# Patient Record
Sex: Male | Born: 2000 | Race: White | Hispanic: No | Marital: Single | Smoking: Current every day smoker
Health system: Southern US, Community
[De-identification: ages and names within clinical notes are randomized; demographics above are authoritative.]

## PROBLEM LIST (undated history)

## (undated) DIAGNOSIS — F419 Anxiety disorder, unspecified: Secondary | ICD-10-CM

## (undated) DIAGNOSIS — H9190 Unspecified hearing loss, unspecified ear: Secondary | ICD-10-CM

## (undated) DIAGNOSIS — R278 Other lack of coordination: Secondary | ICD-10-CM

## (undated) DIAGNOSIS — F909 Attention-deficit hyperactivity disorder, unspecified type: Secondary | ICD-10-CM

## (undated) DIAGNOSIS — F902 Attention-deficit hyperactivity disorder, combined type: Secondary | ICD-10-CM

## (undated) HISTORY — DX: Other lack of coordination: R27.8

## (undated) HISTORY — DX: Anxiety disorder, unspecified: F41.9

## (undated) HISTORY — DX: Attention-deficit hyperactivity disorder, unspecified type: F90.9

## (undated) HISTORY — DX: Unspecified hearing loss, unspecified ear: H91.90

## (undated) HISTORY — DX: Attention-deficit hyperactivity disorder, combined type: F90.2

## (undated) HISTORY — PX: TONSILLECTOMY: SUR1361

---
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
## 2019-01-17 ENCOUNTER — Other Ambulatory Visit: Payer: Self-pay

## 2019-01-17 ENCOUNTER — Encounter: Payer: Self-pay | Admitting: Pediatrics

## 2019-01-17 ENCOUNTER — Ambulatory Visit (INDEPENDENT_AMBULATORY_CARE_PROVIDER_SITE_OTHER): Payer: No Typology Code available for payment source | Admitting: Pediatrics

## 2019-01-17 DIAGNOSIS — Z79899 Other long term (current) drug therapy: Secondary | ICD-10-CM | POA: Diagnosis not present

## 2019-01-17 DIAGNOSIS — F902 Attention-deficit hyperactivity disorder, combined type: Secondary | ICD-10-CM

## 2019-01-17 DIAGNOSIS — H903 Sensorineural hearing loss, bilateral: Secondary | ICD-10-CM | POA: Diagnosis not present

## 2019-01-17 DIAGNOSIS — R278 Other lack of coordination: Secondary | ICD-10-CM | POA: Diagnosis not present

## 2019-01-17 DIAGNOSIS — Z719 Counseling, unspecified: Secondary | ICD-10-CM

## 2019-01-17 DIAGNOSIS — Z7189 Other specified counseling: Secondary | ICD-10-CM

## 2019-01-17 MED ORDER — METHYLPHENIDATE HCL ER (OSM) 54 MG PO TBCR: 54.0000 mg | EXTENDED_RELEASE_TABLET | ORAL | 0 refills | Status: DC

## 2019-01-17 MED ORDER — ATOMOXETINE HCL 100 MG PO CAPS: 100.0000 mg | ORAL_CAPSULE | Freq: Every day | ORAL | 2 refills | Status: DC

## 2019-01-17 MED ORDER — GUANFACINE HCL ER 2 MG PO TB24: 2.0000 mg | ORAL_TABLET | Freq: Every morning | ORAL | 2 refills | Status: DC

## 2019-01-17 NOTE — Progress Notes (Signed)
[facility] [address] [place] [address] Dept: [phone] Dept Fax: [phone]  Medication Check by Zoom due to COVID-19  Patient ID:  Garrett CoronaMitchell Estrada  male DOB: 02/16/2001   17 y.o.   MRN: [id number]   DATE:01/17/19  PCP: Michiel Sitesummings, Mark, MD  Interviewed: Garrett NajjarMitchell D Estrada and Mother  Name: Garrett DoffingMelody Estrada Location: Their home [facility]  Virtual Visit via Video Note Connected with Garrett NajjarMitchell D Estrada on 01/17/19 at  2:00 PM EDT by video enabled telemedicine application and verified that I am speaking with the correct person using two identifiers.    I discussed the limitations, risks, security and privacy concerns of performing an evaluation and management service by telephone and the availability of in person appointments. I also discussed with the parents that there may be a patient responsible charge related to this service. The parents expressed understanding and agreed to proceed.  HISTORY OF PRESENT ILLNESS/CURRENT STATUS: Garrett NajjarMitchell D Estrada is being followed for medication management for ADHD, dysgraphia and learning differences.   Last visit on 10/18/2018  Garrett RileyMitchell currently prescribed Strattera 100 mg, Concerta 54 mg and Intuniv 2 mg   By 0800 Eating well (eating breakfast, lunch and dinner).   Sleeping: bedtime 2300 pm and wakes at 0700  sleeping through the night.   EDUCATION: School: [employer] Year/Grade: 11th grade  Not as much work as usual school Garrett RileyMitchell is currently out of school for social distancing due to COVID-19.  Using zoom, [employer] classroom and email.  Has paper work as well. Check email at 0800 and has math zoom at 0900  Likes it better. Not as distracted, when not with whole class. Just finished spring break.just finished 3rd week of on line instructions. Not a set schedule for other classes. Doing all classes.  Activities/ Exercise:  daily Got a new bike  Screen time: (phone, tablet, TV, computer): social and practicing rubiks cubes. Texts friends   MEDICAL HISTORY: Individual Medical History/ Review of Systems: Changes? :Yes had MRI 11/28/2018 WNL. Has continued with paresthesia starts in hands and can go up arms to torso, happens up to 3 times per week.  Family Medical/ Social History: Changes? No   Patient Lives with: mother and father Sister 13 years  Current Medications:  Strattera 100 mg Concerta 54 mg Intuniv 2 mg  Medication Side Effects: None  MENTAL HEALTH: Mental Health Issues:    Denies sadness, loneliness or depression. No self harm or thoughts of self harm or injury. Denies fears, worries and anxieties. Has good peer relations and is not a bully nor is victimized.  DIAGNOSES:    ICD-10-CM   1. ADHD (attention deficit hyperactivity disorder), combined type F90.2   2. Dysgraphia R27.8   3. Bilateral sensorineural hearing loss H90.3   4. Medication management Z79.899   5. Patient counseled Z71.9   6. Parenting dynamics counseling Z71.89   7. Counseling and coordination of care Z71.89     RECOMMENDATIONS:  Patient Instructions  DISCUSSION: Counseled regarding the following coordination of care items:  Continue medication as directed Strattera 100 mg every morning Concerta 54 mg eery morning Intuniv 2 mg every morning RX for above e-scribed and sent to pharmacy on record  [facility] - [place], [place] - [address] [place] [address] Phone: [phone] Fax: [phone]  Counseled medication administration, effects, and possible side effects.  ADHD medications discussed to include different medications  and pharmacologic properties of each. Recommendation for specific medication to include dose, administration, expected effects, possible side effects and the risk to benefit ratio of medication management.  Advised importance of:  Good sleep hygiene (8- 10  hours per night) Limited screen time (none on school nights, no more than 2 hours on weekends) Regular exercise(outside and active play) Healthy eating (drink water, no sodas/sweet tea)  The unknowns surrounding coronavirus (also known as COVID-19) can be anxiety-producing in both adults and children alike. During these times of uncertainty, you play an important role as a parent, caregiver and support system for your kids. Here are 3 ways you can help your kids cope with their worries.  1. Be intentional in setting aside time to listen to your children's thoughts and concerns. Ask your kids how they're feeling, and really listen when they speak. As parents, it's hard to see our kids struggling, and we get the urge to make them feel better right away - but just listen first. Then, provide validating statements that show your kids that how they're feeling makes sense and that other people are feeling this way, too.  2. Be mindful of your children's news and social media intake. If your family typically lets the news run in the background as you go about your day, take this time to set limits and choose specific times to watch the news. Be mindful of what exactly your children watch.  Additionally, be mindful of how you talk about the news with your children. It's not just what we say that matters, but how we say it. If you're carrying a lot of anxiety, be careful of how it comes through as you speak and identify ways to manage that.  3. Empower your kids to help others by teaching them about social distancing and healthy habits. Framing social distancing as something your kids can do to help others empowers them to feel more in control of the situation. In terms of healthy habit behaviors like coughing in your elbow and handwashing, model them for your kids. Provide attention and praise when they practice those behaviors. For some of the more difficult habits - like avoiding touching your face - try a  fun reinforcement system. Setting a timer for a very short time and seeing how long kids can go without touching their face is a way to make practicing healthy habits fun.  About the Author Charlyne Mom, PhD    Discussed continued need for routine, structure, motivation, reward and positive reinforcement  Encouraged recommended limitations on TV, tablets, phones, video games and computers for non-educational activities.  Encouraged physical activity and outdoor play, maintaining social distancing.  Discussed how to talk to anxious children about coronavirus.   Referred to ADDitudemag.com for resources about engaging children who are at home in home and online study.    NEXT APPOINTMENT:  Return in about 3 months (around 04/18/2019) for Medication Check. Please call the office for a sooner appointment if problems arise.  Medical Decision-making: More than 50% of the appointment was spent counseling and discussing diagnosis and management of symptoms with the patient and family.  I discussed the assessment and treatment plan with the parent. The parent was provided an opportunity to ask questions and all were answered. The parent agreed with the plan and demonstrated an understanding of the instructions.   The parent was advised to call back or seek an in-person evaluation if the symptoms worsen or if the condition fails to improve as anticipated.  I provided 25 minutes of non-face-to-face time during this encounter.   Completed record review for 10 minutes prior to the virtual video visit.   Leticia Penna, NP  Counseling Time: 25 minutes   Total Contact Time: 25 minutes

## 2019-01-17 NOTE — Patient Instructions (Signed)
DISCUSSION: Counseled regarding the following coordination of care items:  Continue medication as directed Strattera 100 mg every morning Concerta 54 mg eery morning Intuniv 2 mg every morning RX for above e-scribed and sent to pharmacy on record  [facility] - [place],  - [address] [place] [address] Phone: [phone] Fax: [phone]  Counseled medication administration, effects, and possible side effects.  ADHD medications discussed to include different medications and pharmacologic properties of each. Recommendation for specific medication to include dose, administration, expected effects, possible side effects and the risk to benefit ratio of medication management.  Advised importance of:  Good sleep hygiene (8- 10 hours per night) Limited screen time (none on school nights, no more than 2 hours on weekends) Regular exercise(outside and active play) Healthy eating (drink water, no sodas/sweet tea)  The unknowns surrounding coronavirus (also known as COVID-19) can be anxiety-producing in both adults and children alike. During these times of uncertainty, you play an important role as a parent, caregiver and support system for your kids. Here are 3 ways you can help your kids cope with their worries.  1. Be intentional in setting aside time to listen to your children's thoughts and concerns. Ask your kids how they're feeling, and really listen when they speak. As parents, it's hard to see our kids struggling, and we get the urge to make them feel better right away - but just listen first. Then, provide validating statements that show your kids that how they're feeling makes sense and that other people are feeling this way, too.  2. Be mindful of your children's news and social media intake. If your family typically lets the news run in the background as you go about your day, take this time to set limits and choose specific times to watch the news.  Be mindful of what exactly your children watch.  Additionally, be mindful of how you talk about the news with your children. It's not just what we say that matters, but how we say it. If you're carrying a lot of anxiety, be careful of how it comes through as you speak and identify ways to manage that.  3. Empower your kids to help others by teaching them about social distancing and healthy habits. Framing social distancing as something your kids can do to help others empowers them to feel more in control of the situation. In terms of healthy habit behaviors like coughing in your elbow and handwashing, model them for your kids. Provide attention and praise when they practice those behaviors. For some of the more difficult habits - like avoiding touching your face - try a fun reinforcement system. Setting a timer for a very short time and seeing how long kids can go without touching their face is a way to make practicing healthy habits fun.  About the Author Charlyne Mom, PhD

## 2019-02-11 IMAGING — MR MR LUMBAR SPINE W/O CM
4 of 5 series · 15 of 48 positions shown · non-contrast
Comparison: None.

CLINICAL DATA: Low back pain over the last 8 months subsequent to a
fall from a tree.

EXAM:
MRI LUMBAR SPINE WITHOUT CONTRAST
TECHNIQUE: Multiplanar, multisequence MR imaging of the lumbar spine was
performed. No intravenous contrast was administered.

[Series 3: T2 · sagittal · 4.0mm · 0.68mm/px · 6 of 13 slices shown (1 of 2)]
[im 1/13]
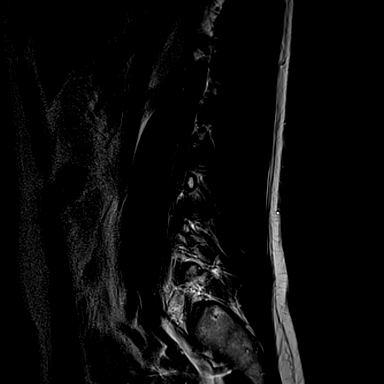
[im 3/13]
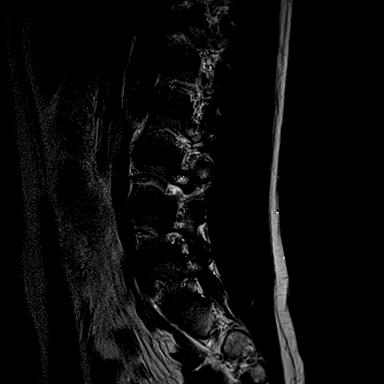
[im 5/13]
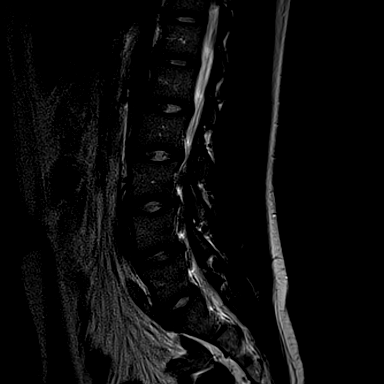
[im 8/13]
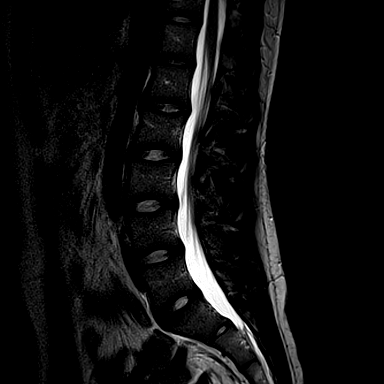
[im 10/13]
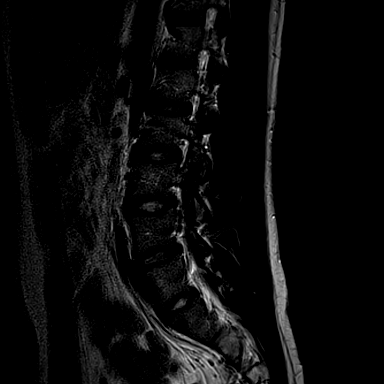
[im 13/13]
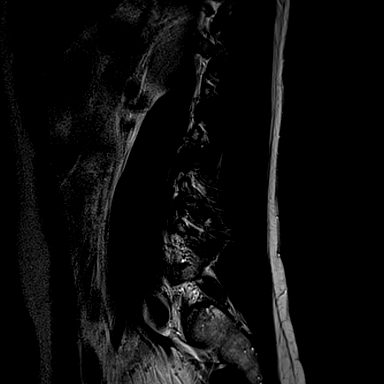

[Series 4: T1 · sagittal · 4.0mm · 0.34mm/px · 3 of 13 slices shown (1 of 2)]
[im 1/13]
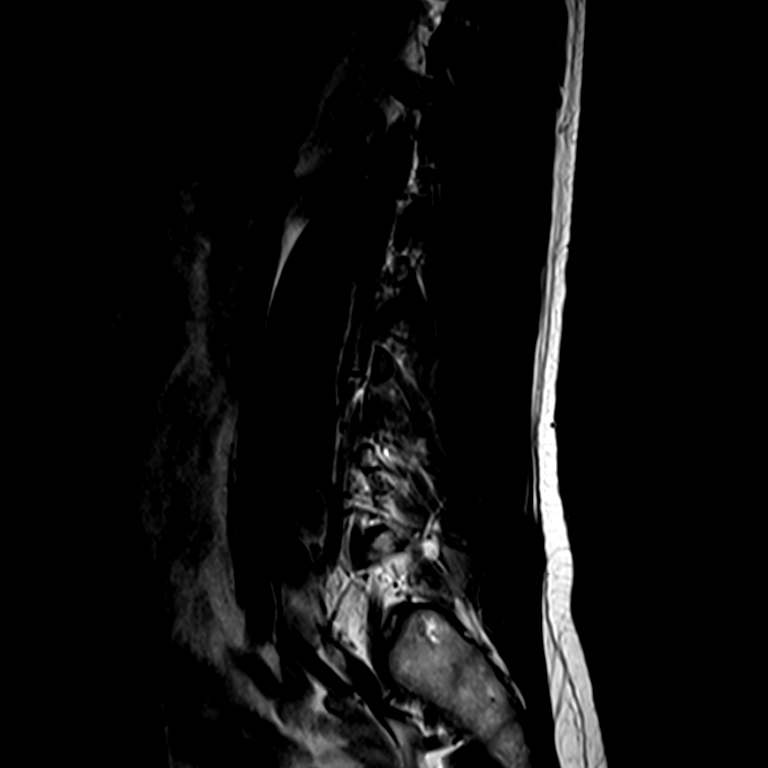
[im 7/13]
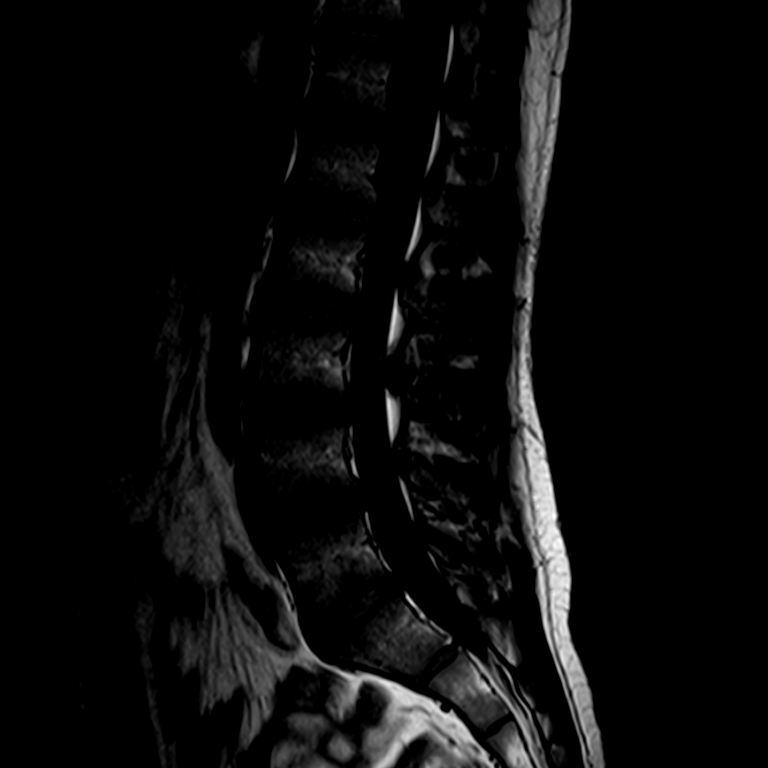
[im 13/13]
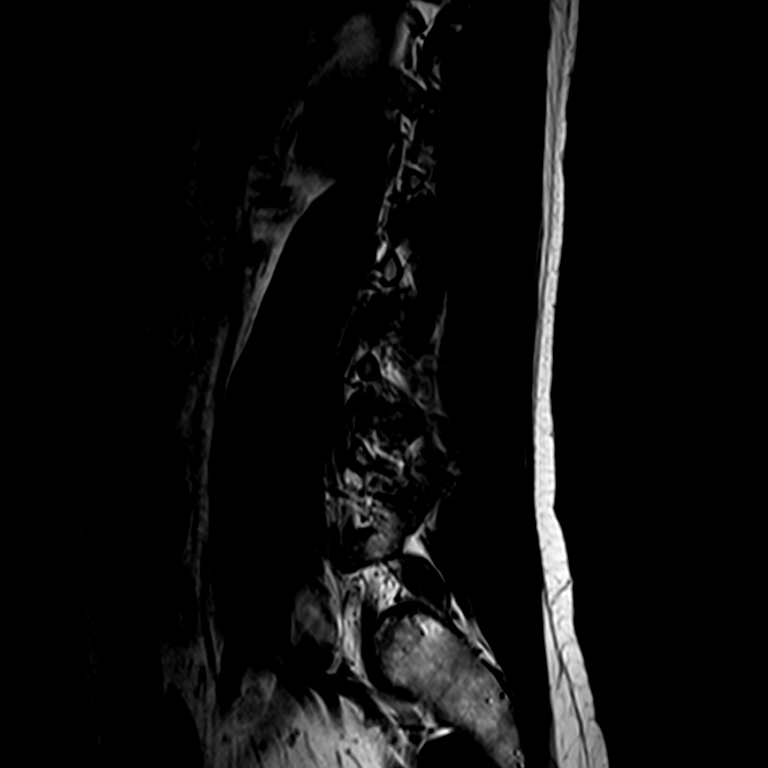

[Series 6: T2 · axial · 4.0mm · 0.22mm/px · z∈[-64,+75]mm · 3 of 38 slices shown (2 of 2)]
[im 5/38]
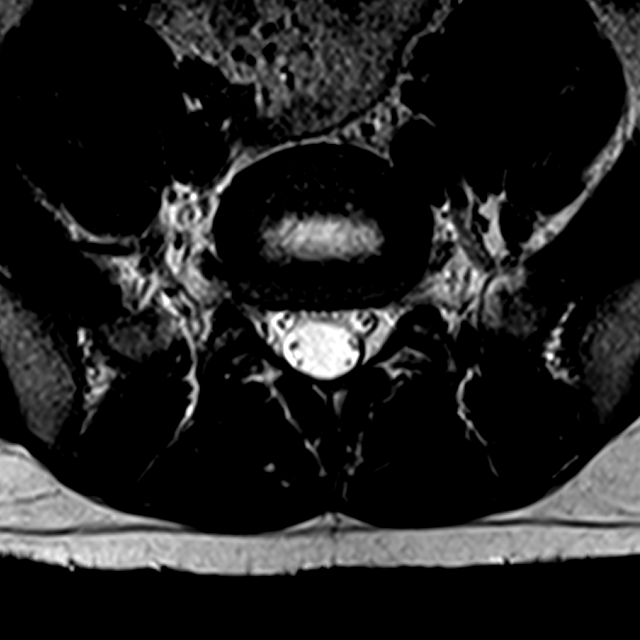
[im 20/38]
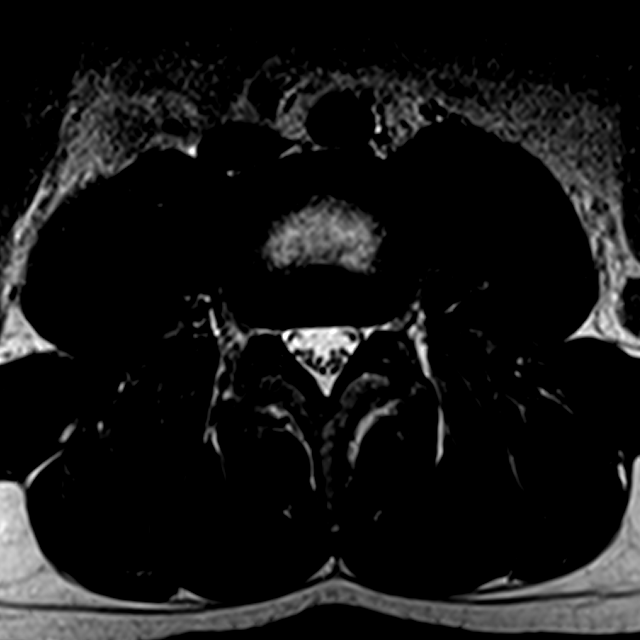
[im 33/38]
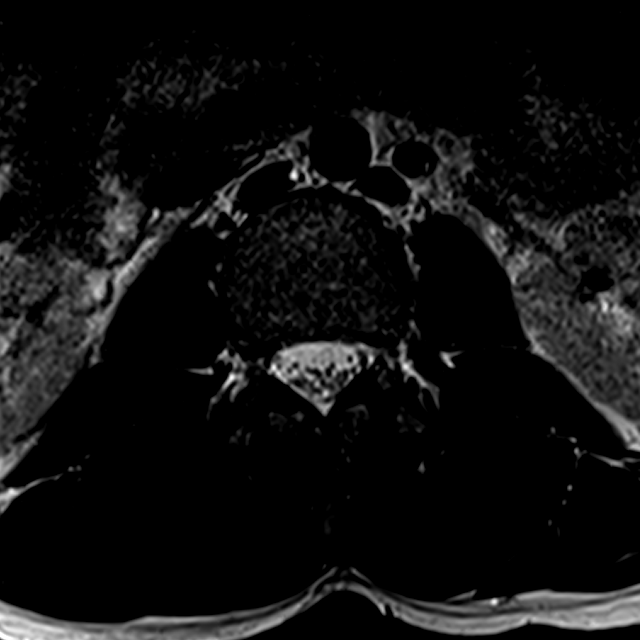

[Series 7: T1 · axial · 4.0mm · 0.22mm/px · z∈[-64,+75]mm · 3 of 38 slices shown (2 of 2)]
[im 5/38]
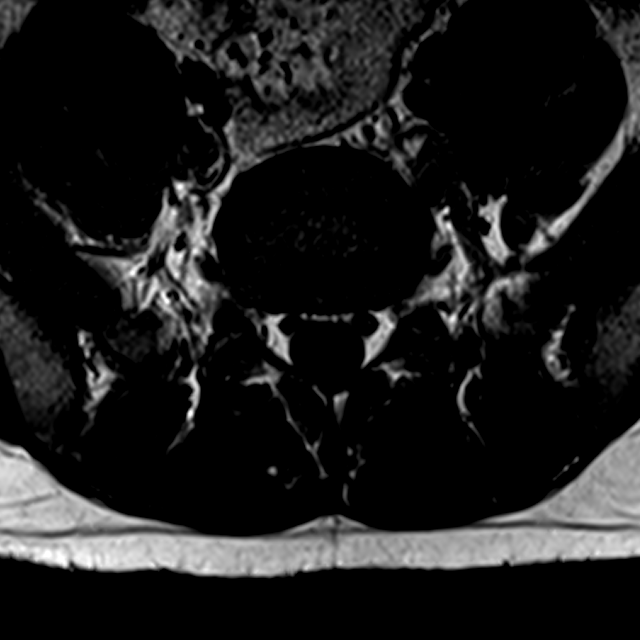
[im 20/38]
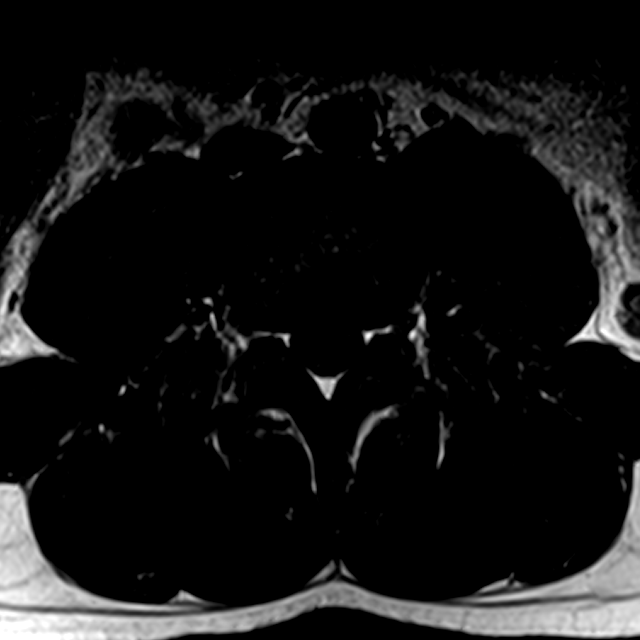
[im 33/38]
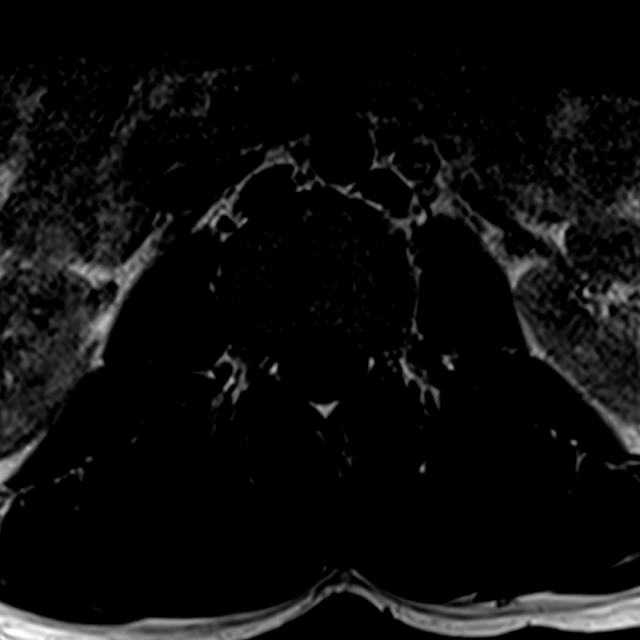

[15 of 48 positions shown; findings below may reference images not displayed]

FINDINGS: Segmentation:  5 lumbar type vertebral bodies.

Alignment:  Normal

Vertebrae: Abnormal edema of the posterior elements of L3. Suspicion
of pars defects or pars fractures at this level. CT scan would be
suggested for more accurate evaluation.

Conus medullaris and cauda equina: Conus extends to the L1 level.
Conus and cauda equina appear normal.

Paraspinal and other soft tissues: Negative

Disc levels:

No abnormality at L2-3 or above.

L3-4: Mild desiccation and bulging of the disc. No herniation or
stenosis. As noted above, there is abnormal edema in the posterior
elements, probably secondary to pars fractures or pars defects. CT
scan recommended for more accurate evaluation.

L4-5: Mild disc bulge.  No stenosis or neural compression.

L5-S1: Normal interspace.
IMPRESSION: At L3-4, there is abnormal edema in the posterior elements. I
suspect that there are bilateral pars fractures or pars defects at
this level. Mild bulging of the L3-4 disc without compressive
stenosis. CT scan would be suggested to more accurately characterize
the bony pathology.

## 2019-02-22 ENCOUNTER — Other Ambulatory Visit: Payer: Self-pay | Admitting: Pediatrics

## 2019-02-22 NOTE — Telephone Encounter (Signed)
Last visit 01/17/2019

## 2019-02-22 NOTE — Telephone Encounter (Signed)
E-Prescribed Concerta 54 directly to  [facility] - [place], [place] - [address] [place] [address] Phone: [phone] Fax: [phone]  

## 2019-03-22 ENCOUNTER — Telehealth: Payer: Self-pay | Admitting: Pediatrics

## 2019-03-22 NOTE — Telephone Encounter (Signed)
Last visit 01/17/2019

## 2019-03-22 NOTE — Telephone Encounter (Signed)
RX for above e-scribed and sent to pharmacy on record  [facility] - [place], [place] - [address]  [place] [address] Phone: [phone] Fax: [phone] 

## 2019-03-22 NOTE — Telephone Encounter (Signed)
[facility] scheduled return appointment

## 2019-04-05 ENCOUNTER — Other Ambulatory Visit: Payer: Self-pay

## 2019-04-05 ENCOUNTER — Encounter (HOSPITAL_COMMUNITY): Payer: Self-pay | Admitting: Psychiatry

## 2019-04-05 ENCOUNTER — Ambulatory Visit (INDEPENDENT_AMBULATORY_CARE_PROVIDER_SITE_OTHER): Payer: Medicaid Other | Admitting: Psychiatry

## 2019-04-05 DIAGNOSIS — F411 Generalized anxiety disorder: Secondary | ICD-10-CM | POA: Diagnosis not present

## 2019-04-05 DIAGNOSIS — F988 Other specified behavioral and emotional disorders with onset usually occurring in childhood and adolescence: Secondary | ICD-10-CM | POA: Diagnosis not present

## 2019-04-05 MED ORDER — ATOMOXETINE HCL 100 MG PO CAPS: 100.0000 mg | ORAL_CAPSULE | Freq: Every day | ORAL | 2 refills | Status: DC

## 2019-04-05 MED ORDER — ESCITALOPRAM OXALATE 10 MG PO TABS: 10.0000 mg | ORAL_TABLET | Freq: Every day | ORAL | 2 refills | Status: DC

## 2019-04-05 MED ORDER — METHYLPHENIDATE HCL ER (OSM) 54 MG PO TBCR: EXTENDED_RELEASE_TABLET | ORAL | 0 refills | Status: DC

## 2019-04-05 MED ORDER — GUANFACINE HCL ER 2 MG PO TB24: 2.0000 mg | ORAL_TABLET | Freq: Every morning | ORAL | 2 refills | Status: DC
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
## 2019-04-24 ENCOUNTER — Other Ambulatory Visit: Payer: Medicaid Other

## 2019-04-24 ENCOUNTER — Other Ambulatory Visit: Payer: Self-pay

## 2019-04-24 DIAGNOSIS — Z20822 Contact with and (suspected) exposure to covid-19: Secondary | ICD-10-CM

## 2019-04-28 LAB — NOVEL CORONAVIRUS, NAA: SARS-CoV-2, NAA: NOT DETECTED

## 2019-05-09 ENCOUNTER — Other Ambulatory Visit: Payer: Self-pay

## 2019-05-09 ENCOUNTER — Encounter (HOSPITAL_COMMUNITY): Payer: Self-pay | Admitting: Psychiatry

## 2019-05-09 ENCOUNTER — Ambulatory Visit (INDEPENDENT_AMBULATORY_CARE_PROVIDER_SITE_OTHER): Payer: Medicaid Other | Admitting: Psychiatry

## 2019-05-09 DIAGNOSIS — F988 Other specified behavioral and emotional disorders with onset usually occurring in childhood and adolescence: Secondary | ICD-10-CM

## 2019-05-09 DIAGNOSIS — F411 Generalized anxiety disorder: Secondary | ICD-10-CM

## 2019-05-09 MED ORDER — ATOMOXETINE HCL 100 MG PO CAPS: 100.0000 mg | ORAL_CAPSULE | Freq: Every day | ORAL | 2 refills | Status: DC

## 2019-05-09 MED ORDER — GUANFACINE HCL ER 2 MG PO TB24: 2.0000 mg | ORAL_TABLET | Freq: Every morning | ORAL | 2 refills | Status: DC

## 2019-05-09 MED ORDER — METHYLPHENIDATE HCL ER (OSM) 54 MG PO TBCR: EXTENDED_RELEASE_TABLET | ORAL | 0 refills | Status: DC
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
## 2019-05-18 ENCOUNTER — Encounter: Payer: Self-pay | Admitting: Pediatrics

## 2019-05-18 ENCOUNTER — Ambulatory Visit (INDEPENDENT_AMBULATORY_CARE_PROVIDER_SITE_OTHER): Payer: Medicaid Other | Admitting: Pediatrics

## 2019-05-18 ENCOUNTER — Other Ambulatory Visit: Payer: Self-pay

## 2019-05-18 DIAGNOSIS — F902 Attention-deficit hyperactivity disorder, combined type: Secondary | ICD-10-CM | POA: Diagnosis not present

## 2019-05-18 DIAGNOSIS — H903 Sensorineural hearing loss, bilateral: Secondary | ICD-10-CM

## 2019-05-18 DIAGNOSIS — Z7189 Other specified counseling: Secondary | ICD-10-CM | POA: Diagnosis not present

## 2019-05-18 DIAGNOSIS — R278 Other lack of coordination: Secondary | ICD-10-CM | POA: Diagnosis not present

## 2019-05-18 NOTE — Progress Notes (Signed)
[facility]. [address] [place] [address] Dept: [phone] Dept Fax: [phone]  Phone consult due to COVID-19  Patient ID:  Garrett Estrada  male DOB: 2000-12-12   18 y.o.   MRN: [id number]   DATE:05/18/19  PCP: Harden Mo, MD  Interviewed:Mother of  Garrett Estrada Name: Garrett Estrada Location: Their home Provider location: [facility] Office   Virtual Visit via Telephone Note Contacted by telephone and verified that I am speaking with the correct person using two identifiers.   I discussed the limitations, risks, security and privacy concerns of performing an evaluation and management service by telephone and the availability of in person appointments. I also discussed with the parents that there may be a patient responsible charge related to this service. The parents expressed understanding and agreed to proceed.  HISTORY OF PRESENT ILLNESS/CURRENT STATUS: Garrett Estrada was being followed for medication management for ADHD, dysgraphia and learning differences.   Last visit on 01/17/2019  Mother initiated phone call and cancelled follow up visit due to now established care with adult psychiatry - Garrett Spiller, MD.   Notes reviewed during this phone visit.  EDUCATION: School: [employer] Year/Grade: rising 12th  Working at [employer] and doing well per mother.  MEDICAL HISTORY: Individual Medical History/ Review of Systems: Changes? Garrett Estrada is currently out of school for social distancing due to COVID-19.  Family is currently in quarantine due to sister positive and with symptoms for COVID19. Patient tested negative on 05/08/2019 and is asymptomatic currently.  Family Medical/ Social History: Changes? Yes   Sister had time with friends and positive for COVID Patient Lives with: mother, father and sister age 65 years  Current Medications:  Per psychiatry as of  04/09/2019  MENTAL HEALTH: Mental Health Issues:    Denies sadness, loneliness or depression. No self harm or thoughts of self harm or injury. Denies fears, worries and anxieties. Has good peer relations and is not a bully nor is victimized.  DIAGNOSES:    ICD-10-CM   1. ADHD (attention deficit hyperactivity disorder), combined type  F90.2   2. Bilateral sensorineural hearing loss  H90.3   3. Dysgraphia  R27.8   4. Counseling and coordination of care  Z71.89      RECOMMENDATIONS:  Patient Instructions  DISCUSSION: Counseled regarding the following coordination of care items:  Transition of care to adult psychiatry, Garrett Spiller, MD.    Discussed continued need for routine, structure, motivation, reward and positive reinforcement  Encouraged recommended limitations on TV, tablets, phones, video games and computers for non-educational activities.  Encouraged physical activity and outdoor play, maintaining social distancing.  Discussed how to talk to anxious children about coronavirus.   Referred to [email or web address].[email or web address] for resources about engaging children who are at home in home and online study.    NEXT APPOINTMENT:  Return if symptoms worsen or fail to improve. Please call the office for a sooner appointment if problems arise.  Medical Decision-making: More than 50% of the appointment was spent counseling and discussing diagnosis and management of symptoms with the patient and family.  I discussed the assessment and treatment plan with the parent. The parent was provided an opportunity to ask questions and all were answered. The parent agreed with the plan and demonstrated an understanding of the instructions.   The parent was advised to call back or seek an in-person evaluation if the symptoms worsen or if the condition fails  to improve as anticipated.  I provided 10 minutes of non-face-to-face time during this encounter.   Completed record review for 10 minutes prior to the  virtual telephone visit.   Gavrielle Streck A Harrold Donathrump, NP  Counseling Time: 10 minutes   Total Contact Time: 20 minutes

## 2019-05-18 NOTE — Patient Instructions (Addendum)
DISCUSSION: Counseled regarding the following coordination of care items:  Transition of care to adult psychiatry, Levonne Spiller, MD.

## 2019-05-23 ENCOUNTER — Institutional Professional Consult (permissible substitution): Payer: Medicaid Other | Admitting: Pediatrics

## 2019-05-23 ENCOUNTER — Other Ambulatory Visit: Payer: Self-pay

## 2019-05-23 DIAGNOSIS — Z20822 Contact with and (suspected) exposure to covid-19: Secondary | ICD-10-CM

## 2019-05-24 LAB — NOVEL CORONAVIRUS, NAA: SARS-CoV-2, NAA: NOT DETECTED

## 2019-06-07 ENCOUNTER — Ambulatory Visit (HOSPITAL_COMMUNITY): Payer: Medicaid Other | Admitting: Psychiatry

## 2019-07-13 ENCOUNTER — Encounter: Payer: Self-pay | Admitting: Internal Medicine

## 2019-07-13 ENCOUNTER — Ambulatory Visit: Payer: Medicaid Other | Admitting: Internal Medicine

## 2019-07-13 ENCOUNTER — Other Ambulatory Visit: Payer: Self-pay

## 2019-07-13 DIAGNOSIS — G4710 Hypersomnia, unspecified: Secondary | ICD-10-CM | POA: Insufficient documentation

## 2019-07-13 DIAGNOSIS — F5101 Primary insomnia: Secondary | ICD-10-CM | POA: Diagnosis not present

## 2019-07-13 DIAGNOSIS — G4700 Insomnia, unspecified: Secondary | ICD-10-CM | POA: Insufficient documentation

## 2019-07-13 NOTE — Progress Notes (Signed)
07/13/2019- 18 yoM never smoker on kind referral from Dr Neita Carp for sleep evaluation. pt states he has trouble going to sleep at night and staying awake during the day   Father is here. Garrett Estrada is adopted. Current meds include Lexapro, Strattera, Concerta, guanfacine Medical problem list includes ADHD ( Dr Gavin Pound Ross/ Psych),  Epworth score 9 Body weight today 188 lbs Managed for ADHD over the past 6-8 years. Bedtime 10-11PM, sleep latency 3-4 hours- better in the last week since he started Unisom. Recent addition of Lexapro taken at night also helps.  Wakes for up to 30 minutes after sleep onset, then regains sleep until 7AM. Occasional morning coffee and may take an energy drink or soda later in day. Resists urge to nap. Talks in sleep, without sleep walking. Admits sleep paralysis  1-2 x / week, in middle of night- unpleasant and may last several minutes. 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 One episode driving with mother- he had been driving about 2 hours, began to get tunnel vision. Pulled over to let her drive and says he fell right to sleep. No hx migraine or seizure.  No significant snoring. ENT+ tonsillectomy. No hx thyroid. 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   Prior to Admission medications   Medication Sig Start Date End Date Taking? Authorizing Provider  atomoxetine (STRATTERA) 100 MG capsule Take 1 capsule (100 mg total) by mouth daily. 05/09/19  Yes Myrlene Broker, MD  clindamycin (CLEOCIN T) 1 % external solution Apply to face once daily 03/01/15  Yes [provider]  escitalopram (LEXAPRO) 10 MG tablet Take 1 tablet (10 mg total) by mouth daily. 04/05/19 04/04/20 Yes Myrlene Broker, MD  guanFACINE (INTUNIV) 2 MG TB24 ER tablet Take 1 tablet (2 mg total) by mouth every morning. 05/09/19  Yes Myrlene Broker, MD  methylphenidate (CONCERTA) 54 MG PO CR tablet TAKE 1 TABLET BY MOUTH EACH MORNING. 05/09/19  Yes  Myrlene Broker, MD   Past Medical History:  Diagnosis Date  . ADHD (attention deficit hyperactivity disorder)   . ADHD (attention deficit hyperactivity disorder), combined type 12/24/2015  . Anxiety   . Dysgraphia 12/24/2015  . Hearing loss    wears bilateral hearing aids since early childhood approximately 57 or 18 years of age   Past Surgical History:  Procedure Laterality Date  . TONSILLECTOMY     Family History  Adopted: Yes  Problem Relation Age of Onset  . Drug abuse Mother   . Drug abuse Father    Social History   Socioeconomic History  . Marital status: Single    Spouse name: Not on file  . Number of children: Not on file  . Years of education: Not on file  . Highest education level: Not on file  Occupational History  . Not on file  Social Needs  . Financial resource strain: Not on file  . Food insecurity    Worry: Not on file    Inability: Not on file  . Transportation needs    Medical: Not on file    Non-medical: Not on file  Tobacco Use  . Smoking status: Never Smoker  . Smokeless tobacco: Never Used  Substance and Sexual Activity  . Alcohol use: No    Alcohol/week: 0.0 standard drinks  . Drug use: No  . Sexual activity: Never  Lifestyle  . Physical activity    Days per week: Not on file    Minutes per  session: Not on file  . Stress: Not on file  Relationships  . Social Musicianconnections    Talks on phone: Not on file    Gets together: Not on file    Attends religious service: Not on file    Active member of club or organization: Not on file    Attends meetings of clubs or organizations: Not on file    Relationship status: Not on file  . Intimate partner violence    Fear of current or ex partner: Not on file    Emotionally abused: Not on file    Physically abused: Not on file    Forced sexual activity: Not on file  Other Topics Concern  . Not on file  Social History Narrative  . Not on file   ROS-see HPI   + = positive Constitutional:    weight  loss, night sweats, fevers, chills, +fatigue, lassitude. HEENT:    headaches, difficulty swallowing, tooth/dental problems, sore throat,       sneezing, itching, ear ache, nasal congestion, post nasal drip, snoring CV:    chest pain, orthopnea, PND, swelling in lower extremities, anasarca,                                  dizziness, palpitations Resp:   shortness of breath with exertion or at rest.                productive cough,   non-productive cough, coughing up of blood.              change in color of mucus.  wheezing.   Skin:    rash or lesions. GI:  No-   heartburn, indigestion, abdominal pain, nausea, vomiting, diarrhea,                 change in bowel habits, loss of appetite GU: dysuria, change in color of urine, no urgency or frequency.   flank pain. MS:   joint pain, stiffness, decreased range of motion, back pain. Neuro-     nothing unusual Psych:  change in mood or affect.  depression or anxiety.   memory loss.  OBJ- Physical Exam General- Alert, Oriented, Affect-appropriate, Distress- none acute, + mild overweight Skin- rash-none, lesions- none, excoriation- none Lymphadenopathy- none Head- atraumatic            Eyes- Gross vision intact, PERRLA, conjunctivae and secretions clear            Ears- Hearing, canals-normal            Nose- Clear, no-Septal dev, mucus, polyps, erosion, perforation             Throat- Mallampati III , mucosa clear , drainage- none, tonsils- atrophic Neck- flexible , trachea midline, no stridor , thyroid nl, carotid no bruit Chest - symmetrical excursion , unlabored           Heart/CV- RRR , no murmur , no gallop  , no rub, nl s1 s2                           - JVD- none , edema- none, stasis changes- none, varices- none           Lung- + coarse upper zones, wheeze- none, cough- none , dullness-none, rub- none           Chest wall-  Abd-  Br/  Gen/ Rectal- Not done, not indicated Extrem- cyanosis- none, clubbing, none, atrophy- none, strength-  nl Neuro- grossly intact to observation

## 2019-07-13 NOTE — Assessment & Plan Note (Signed)
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 He doesn't describe overwhelming daytime sleepiness, but medication effects and caffeine may mask this. A component of obstructive sleep apnea may be present. For clear evaluation of primary hypersomnia and possible Narcolepsy, we discussed getting an NPSG followed by MSLT. MSLT would not be very useful unless he could be off his several meds that impact sleep and the amount of REM- stimulants and SSRIs. He is new with Dr Harrington Challenger, and has been on some of these meds a long time. We will ask Dr Harrington Challenger to see whether she can wean off any of the ADD meds. Then, if he can be off the rest, including Lexapro, for 2 weeks, we can do the sleep studies.

## 2019-07-13 NOTE — Patient Instructions (Signed)
Ok to continue Unisom or a similar otc antihistamine-based sleep medicine like Zzquil, and avoid caffeine in the afternoon, so you can fall asleep more easily.   I hope Dr Harrington Challenger will be able to reduce some of your medicines to get them out of the way.

## 2019-07-13 NOTE — Assessment & Plan Note (Signed)
Difficulty initiating sleep. 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 Plan- Avoid afternoon caffeine. Ok to try Unisom longer, since first days of use have been better.

## 2019-07-18 ENCOUNTER — Encounter: Payer: Self-pay | Admitting: Internal Medicine

## 2019-08-02 ENCOUNTER — Telehealth (HOSPITAL_COMMUNITY): Payer: Self-pay | Admitting: *Deleted

## 2019-08-02 NOTE — Telephone Encounter (Signed)
LVM ASKING FOR A CALL BACK CONCERNING DR.CLINTON YOUNG asked Dr Harrington Challenger to see whether she can wean off any of the ADD meds. Then, if he can be off the rest, including Lexapro, for 2 weeks, we can do the sleep studies.

## 2019-10-16 ENCOUNTER — Other Ambulatory Visit (HOSPITAL_COMMUNITY): Payer: Self-pay | Admitting: Psychiatry

## 2019-10-16 NOTE — Telephone Encounter (Signed)
Please find out if he plans to return to see me

## 2019-10-17 NOTE — Telephone Encounter (Signed)
Per provider request:Please find out if he plans to return to see me.  Call attempted to M# with a person named "Randy voice message" came on. & then tired the H# & no voice mail came just rung continuously. Will send letter in mail for appointment reminder .

## 2020-07-03 ENCOUNTER — Other Ambulatory Visit (HOSPITAL_COMMUNITY): Payer: Self-pay | Admitting: Sports Medicine

## 2020-07-03 DIAGNOSIS — M545 Low back pain, unspecified: Secondary | ICD-10-CM

## 2020-07-05 ENCOUNTER — Ambulatory Visit (HOSPITAL_COMMUNITY): Payer: Medicaid Other

## 2020-07-15 ENCOUNTER — Ambulatory Visit (HOSPITAL_COMMUNITY): Payer: Medicaid Other

## 2020-07-23 ENCOUNTER — Other Ambulatory Visit: Payer: Self-pay

## 2020-07-23 ENCOUNTER — Ambulatory Visit (HOSPITAL_COMMUNITY)
Admission: RE | Admit: 2020-07-23 | Discharge: 2020-07-23 | Disposition: A | Discharge status: Home / Self Care | Payer: Medicaid Other | Source: Ambulatory Visit | Attending: Sports Medicine | Admitting: Sports Medicine

## 2020-07-23 DIAGNOSIS — M545 Low back pain, unspecified: Secondary | ICD-10-CM | POA: Diagnosis present

## 2022-07-28 ENCOUNTER — Telehealth (HOSPITAL_COMMUNITY): Payer: Self-pay

## 2022-07-28 NOTE — Telephone Encounter (Signed)
Called pt to schedule appt with therapy due to receiving referral pt's mom answered will have pt call back

## 2022-07-30 ENCOUNTER — Encounter (HOSPITAL_COMMUNITY): Payer: Self-pay | Admitting: Psychiatry

## 2022-07-30 ENCOUNTER — Ambulatory Visit (INDEPENDENT_AMBULATORY_CARE_PROVIDER_SITE_OTHER): Payer: Medicaid Other | Admitting: Psychiatry

## 2022-07-30 DIAGNOSIS — F410 Panic disorder [episodic paroxysmal anxiety] without agoraphobia: Secondary | ICD-10-CM

## 2022-07-30 DIAGNOSIS — F5104 Psychophysiologic insomnia: Secondary | ICD-10-CM | POA: Diagnosis not present

## 2022-07-30 DIAGNOSIS — F902 Attention-deficit hyperactivity disorder, combined type: Secondary | ICD-10-CM | POA: Diagnosis not present

## 2022-07-30 DIAGNOSIS — F411 Generalized anxiety disorder: Secondary | ICD-10-CM | POA: Diagnosis not present

## 2022-07-30 DIAGNOSIS — F331 Major depressive disorder, recurrent, moderate: Secondary | ICD-10-CM | POA: Diagnosis not present

## 2022-07-30 MED ORDER — GUANFACINE HCL ER 1 MG PO TB24: 1.0000 mg | ORAL_TABLET | Freq: Every day | ORAL | 1 refills | Status: AC

## 2022-07-30 MED ORDER — ATOMOXETINE HCL 25 MG PO CAPS: 25.0000 mg | ORAL_CAPSULE | Freq: Every day | ORAL | 1 refills | Status: AC

## 2022-07-30 NOTE — Patient Instructions (Signed)
We restarted your intuniv (guanfacine ER) at 1mg  daily, target dose will be 2 mg daily. In 1 week, restart strattera at 25mg  daily and your next provider will work toward goal of 100mg  daily. After that, they can add back Concerta if needed at 18mg  daily, working toward goal of 54mg  daily. You should continue with zoloft (sertraline) at 50mg  daily for now, though as dose of strattera increases you may be able to discontinue this. Try to get established as quickly as you can and have your next provider call our office to discuss your case with Dr. Nehemiah Settle. You would also likely benefit from finding a psychotherapist in your new area that is skilled with treating clients in the [place] community.
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
